# Patient Record
Sex: Female | Born: 2000 | Race: White | Hispanic: No | Marital: Single | Smoking: Current every day smoker
Health system: Southern US, Community
[De-identification: ages and names within clinical notes are randomized; demographics above are authoritative.]

## PROBLEM LIST (undated history)

## (undated) DIAGNOSIS — L309 Dermatitis, unspecified: Secondary | ICD-10-CM

## (undated) DIAGNOSIS — J45909 Unspecified asthma, uncomplicated: Secondary | ICD-10-CM

## (undated) DIAGNOSIS — F419 Anxiety disorder, unspecified: Secondary | ICD-10-CM

## (undated) DIAGNOSIS — F410 Panic disorder [episodic paroxysmal anxiety] without agoraphobia: Secondary | ICD-10-CM

## (undated) DIAGNOSIS — Z9889 Other specified postprocedural states: Secondary | ICD-10-CM

## (undated) DIAGNOSIS — L509 Urticaria, unspecified: Secondary | ICD-10-CM

## (undated) HISTORY — PX: OTHER SURGICAL HISTORY: SHX169

## (undated) HISTORY — PX: SPINAL FUSION: SHX223

## (undated) HISTORY — PX: TEAR DUCT PROBING: SHX793

## (undated) HISTORY — DX: Dermatitis, unspecified: L30.9

## (undated) HISTORY — PX: BACK SURGERY: SHX140

## (undated) HISTORY — DX: Urticaria, unspecified: L50.9

---
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
## 2018-05-21 ENCOUNTER — Other Ambulatory Visit: Payer: Self-pay | Admitting: Allergy and Immunology

## 2018-10-16 ENCOUNTER — Other Ambulatory Visit: Payer: Self-pay | Admitting: Neurosurgery

## 2018-10-16 DIAGNOSIS — M4120 Other idiopathic scoliosis, site unspecified: Secondary | ICD-10-CM

## 2018-10-27 ENCOUNTER — Other Ambulatory Visit: Payer: BLUE CROSS/BLUE SHIELD

## 2018-11-07 ENCOUNTER — Other Ambulatory Visit: Payer: BLUE CROSS/BLUE SHIELD

## 2018-12-11 ENCOUNTER — Emergency Department (HOSPITAL_BASED_OUTPATIENT_CLINIC_OR_DEPARTMENT_OTHER)
Admission: EM | Admit: 2018-12-11 | Discharge: 2018-12-12 | Disposition: A | Discharge status: Home / Self Care | Payer: BC Managed Care – PPO | Attending: Emergency Medicine | Admitting: Emergency Medicine

## 2018-12-11 ENCOUNTER — Other Ambulatory Visit: Payer: Self-pay

## 2018-12-11 ENCOUNTER — Encounter (HOSPITAL_BASED_OUTPATIENT_CLINIC_OR_DEPARTMENT_OTHER): Payer: Self-pay | Admitting: Adult Health

## 2018-12-11 DIAGNOSIS — R109 Unspecified abdominal pain: Secondary | ICD-10-CM | POA: Diagnosis not present

## 2018-12-11 DIAGNOSIS — F17290 Nicotine dependence, other tobacco product, uncomplicated: Secondary | ICD-10-CM | POA: Diagnosis not present

## 2018-12-11 DIAGNOSIS — Z79899 Other long term (current) drug therapy: Secondary | ICD-10-CM | POA: Insufficient documentation

## 2018-12-11 DIAGNOSIS — J45909 Unspecified asthma, uncomplicated: Secondary | ICD-10-CM | POA: Insufficient documentation

## 2018-12-11 DIAGNOSIS — R102 Pelvic and perineal pain: Secondary | ICD-10-CM | POA: Insufficient documentation

## 2018-12-11 DIAGNOSIS — Z91040 Latex allergy status: Secondary | ICD-10-CM | POA: Diagnosis not present

## 2018-12-11 NOTE — ED Triage Notes (Signed)
Rebecca West presents with bilateral flank pain, dysuria, frequent urination, and lower abdominal pain that began over a week ago. SHe has a hx of back surgeries and chronic back pain. So she thought it was just her back pain, however the pain became worse and is now associated with nausea.

## 2018-12-12 ENCOUNTER — Encounter (HOSPITAL_BASED_OUTPATIENT_CLINIC_OR_DEPARTMENT_OTHER): Payer: Self-pay

## 2018-12-12 ENCOUNTER — Ambulatory Visit (HOSPITAL_BASED_OUTPATIENT_CLINIC_OR_DEPARTMENT_OTHER)
Admit: 2018-12-12 | Discharge: 2018-12-12 | Disposition: A | Discharge status: Home / Self Care | Payer: BC Managed Care – PPO | Attending: Emergency Medicine | Admitting: Emergency Medicine

## 2018-12-12 LAB — PREGNANCY, URINE: Preg Test, Ur: NEGATIVE

## 2018-12-12 LAB — URINALYSIS, ROUTINE W REFLEX MICROSCOPIC
Bilirubin Urine: NEGATIVE
Glucose, UA: NEGATIVE mg/dL
Hgb urine dipstick: NEGATIVE
Ketones, ur: NEGATIVE mg/dL
Leukocytes,Ua: NEGATIVE
Nitrite: NEGATIVE
Protein, ur: NEGATIVE mg/dL
Specific Gravity, Urine: 1.015 (ref 1.005–1.030)
pH: 7 (ref 5.0–8.0)

## 2018-12-12 LAB — COMPREHENSIVE METABOLIC PANEL
ALT: 11 U/L (ref 0–44)
AST: 12 U/L — ABNORMAL LOW (ref 15–41)
Albumin: 3.7 g/dL (ref 3.5–5.0)
Alkaline Phosphatase: 66 U/L (ref 38–126)
Anion gap: 10 (ref 5–15)
BUN: 8 mg/dL (ref 6–20)
CO2: 24 mmol/L (ref 22–32)
Calcium: 9 mg/dL (ref 8.9–10.3)
Chloride: 102 mmol/L (ref 98–111)
Creatinine, Ser: 0.69 mg/dL (ref 0.44–1.00)
GFR calc Af Amer: >60 mL/min (ref >60)
GFR calc non Af Amer: >60 mL/min (ref >60)
Glucose, Bld: 101 mg/dL — ABNORMAL HIGH (ref 70–99)
Potassium: 3.9 mmol/L (ref 3.5–5.1)
Sodium: 136 mmol/L (ref 135–145)
Total Bilirubin: 0.3 mg/dL (ref 0.3–1.2)
Total Protein: 7.1 g/dL (ref 6.5–8.1)

## 2018-12-12 LAB — CBC
HCT: 39.5 % (ref 36.0–46.0)
Hemoglobin: 13.5 g/dL (ref 12.0–15.0)
MCH: 29.6 pg (ref 26.0–34.0)
MCHC: 34.2 g/dL (ref 30.0–36.0)
MCV: 86.6 fL (ref 80.0–100.0)
Platelets: 300 10*3/uL (ref 150–400)
RBC: 4.56 MIL/uL (ref 3.87–5.11)
RDW: 11.7 % (ref 11.5–15.5)
WBC: 6.6 10*3/uL (ref 4.0–10.5)
nRBC: 0 % (ref 0.0–0.2)

## 2018-12-12 LAB — LIPASE, BLOOD: Lipase: 48 U/L (ref 11–51)

## 2018-12-12 LAB — WET PREP, GENITAL
Clue Cells Wet Prep HPF POC: NONE SEEN
Sperm: NONE SEEN
Trich, Wet Prep: NONE SEEN
Yeast Wet Prep HPF POC: NONE SEEN

## 2018-12-12 MED ORDER — ONDANSETRON HCL 4 MG/2ML IJ SOLN
4.0000 mg | Freq: Once | INTRAMUSCULAR | Status: AC | PRN
  Administered 2018-12-12: 4 mg via INTRAVENOUS
  Filled 2018-12-12: qty 2

## 2018-12-12 NOTE — ED Provider Notes (Signed)
[facility] Provider Note: [name], MD, FACEP  CSN: [id number] MRN: [id number] ARRIVAL: 12/11/18 at 20 ROOM: [place]  Flank Pain   HISTORY OF PRESENT ILLNESS  12/12/18 1:05 AM Rebecca West is a 18 y.o. 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  She is here with 4 days of lower abdominal pain which she describes as having both sharp and crampy components.  She rates her pain as a 7 out of 10.  There is associated bilateral flank pain and left lower back pain as well.  She is not sure if this is like her usual back pain.  She also complains of frequent urination and nausea but no vomiting or diarrhea.  She denies dysuria, fever, chills, cough and shortness of breath.   Past Medical History:  Diagnosis Date  . Anxiety   . Asthma   . Eczema   . History of kidney surgery   . Panic attack   . Urticaria     Past Surgical History:  Procedure Laterality Date  . BACK SURGERY    . bilateral urinary deflux correction    . micro discectomy    . multiple oral surgeries    . SPINAL FUSION    . TEAR DUCT PROBING      Family History  Problem Relation Age of Onset  . Asthma Sister   . Eczema Sister   . Eczema Brother   . Allergic rhinitis Neg Hx   . Urticaria Neg Hx     Social History   Tobacco Use  . Smoking status: Current Every Day Smoker    Types: E-cigarettes  . Smokeless tobacco: Never Used  Substance Use Topics  . Alcohol use: No    Frequency: Never  . Drug use: No    Prior to Admission medications   Medication Sig Start Date End Date Taking? Authorizing Provider  ALBUTEROL IN Inhale into the lungs.    [provider]  EPINEPHrine 0.3 mg/0.3 mL IJ SOAJ injection Inject into the muscle. 08/02/15   [provider]  escitalopram (LEXAPRO) 20 MG tablet Take 20 mg by mouth daily.    [provider]  fluticasone (FLONASE) 50 MCG/ACT nasal spray Place 1-2 sprays into both nostrils  daily. 11/28/17   Bobbitt, Sedalia Muta, MD  hydrOXYzine (ATARAX/VISTARIL) 25 MG tablet Take 25 mg by mouth 3 (three) times daily as needed.    [provider]  levocetirizine (XYZAL) 5 MG tablet TAKE 1 TABLET BY MOUTH EVERY DAY IN THE EVENING 05/21/18   Bobbitt, Sedalia Muta, MD  loratadine (CLARITIN) 10 MG tablet Take 10 mg by mouth daily.    [provider]  norethindrone-ethinyl estradiol (JUNEL FE,GILDESS FE,LOESTRIN FE) 1-20 MG-MCG tablet Take 1 tablet by mouth daily.    [provider]  Olopatadine HCl (PAZEO) 0.7 % SOLN Place 1 drop into both eyes 1 day or 1 dose. 11/28/17   Bobbitt, Sedalia Muta, MD  ranitidine (ZANTAC) 150 MG tablet Take 1 tablet (150 mg total) by mouth 2 (two) times daily. 11/28/17   Bobbitt, Sedalia Muta, MD  triamcinolone ointment (KENALOG) 0.1 % Apply twice daily to red itchy areas below face. 12/02/17   Bobbitt, Sedalia Muta, MD    Allergies Banana, Azithromycin, Latex, and Prednisone   REVIEW OF SYSTEMS  Negative except as noted here or in the History of Present Illness.   PHYSICAL EXAMINATION  Initial Vital Signs Blood pressure 96/67, pulse 86, temperature 99.2 F (  37.3 C), temperature source Oral, resp. rate 20, height 5\' 3"  (1.6 m), weight 93.9 kg, last menstrual period 12/02/2018, SpO2 97 %.  Examination General: Well-developed, well-nourished female in no acute distress; appearance consistent with age of record HENT: normocephalic; atraumatic Eyes: pupils equal, round and reactive to light; extraocular muscles intact Neck: supple Heart: regular rate and rhythm Lungs: clear to auscultation bilaterally Abdomen: soft; nondistended; suprapubic tenderness; bowel sounds present GU: Normal external genitalia; physiologic appearing vaginal discharge; slight vaginal bleeding; no cervical motion tenderness; right adnexal tenderness Back: Bilateral CVA tenderness, left paraspinal tenderness Extremities: No deformity; full range of  motion; pulses normal Neurologic: Awake, alert and oriented; motor function intact in all extremities and symmetric; no facial droop Skin: Warm and dry Psychiatric: Normal mood and affect   RESULTS  Summary of this visit's results, reviewed by myself:   EKG Interpretation  Date/Time:    Ventricular Rate:    PR Interval:    QRS Duration:   QT Interval:    QTC Calculation:   R Axis:     Text Interpretation:        Laboratory Studies: Results for orders placed or performed during the hospital encounter of 12/11/18 (from the past 24 hour(s))  Pregnancy, urine     Status: None   Collection Time: 12/12/18 12:00 AM  Result Value Ref Range   Preg Test, Ur NEGATIVE NEGATIVE  Urinalysis, Routine w reflex microscopic     Status: None   Collection Time: 12/12/18 12:00 AM  Result Value Ref Range   Color, Urine YELLOW YELLOW   APPearance CLEAR CLEAR   Specific Gravity, Urine 1.015 1.005 - 1.030   pH 7.0 5.0 - 8.0   Glucose, UA NEGATIVE NEGATIVE mg/dL   Hgb urine dipstick NEGATIVE NEGATIVE   Bilirubin Urine NEGATIVE NEGATIVE   Ketones, ur NEGATIVE NEGATIVE mg/dL   Protein, ur NEGATIVE NEGATIVE mg/dL   Nitrite NEGATIVE NEGATIVE   Leukocytes,Ua NEGATIVE NEGATIVE  Lipase, blood     Status: None   Collection Time: 12/12/18 12:44 AM  Result Value Ref Range   Lipase 48 11 - 51 U/L  Comprehensive metabolic panel     Status: Abnormal   Collection Time: 12/12/18 12:44 AM  Result Value Ref Range   Sodium 136 135 - 145 mmol/L   Potassium 3.9 3.5 - 5.1 mmol/L   Chloride 102 98 - 111 mmol/L   CO2 24 22 - 32 mmol/L   Glucose, Bld 101 (H) 70 - 99 mg/dL   BUN 8 6 - 20 mg/dL   Creatinine, Ser 9.810.69 0.44 - 1.00 mg/dL   Calcium 9.0 8.9 - 19.110.3 mg/dL   Total Protein 7.1 6.5 - 8.1 g/dL   Albumin 3.7 3.5 - 5.0 g/dL   AST 12 (L) 15 - 41 U/L   ALT 11 0 - 44 U/L   Alkaline Phosphatase 66 38 - 126 U/L   Total Bilirubin 0.3 0.3 - 1.2 mg/dL   GFR calc non Af Amer >60 >60 mL/min   GFR calc Af Amer  >60 >60 mL/min   Anion gap 10 5 - 15  CBC     Status: None   Collection Time: 12/12/18 12:44 AM  Result Value Ref Range   WBC 6.6 4.0 - 10.5 K/uL   RBC 4.56 3.87 - 5.11 MIL/uL   Hemoglobin 13.5 12.0 - 15.0 g/dL   HCT 47.839.5 29.536.0 - 62.146.0 %   MCV 86.6 80.0 - 100.0 fL   MCH 29.6 26.0 - 34.0 pg  MCHC 34.2 30.0 - 36.0 g/dL   RDW 86.7 67.2 - 09.4 %   Platelets 300 150 - 400 K/uL   nRBC 0.0 0.0 - 0.2 %  Wet prep, genital     Status: Abnormal   Collection Time: 12/12/18  1:42 AM   Specimen: Genital  Result Value Ref Range   Yeast Wet Prep HPF POC NONE SEEN NONE SEEN   Trich, Wet Prep NONE SEEN NONE SEEN   Clue Cells Wet Prep HPF POC NONE SEEN NONE SEEN   WBC, Wet Prep HPF POC FEW (A) NONE SEEN   Sperm NONE SEEN    Imaging Studies: No results found.  ED COURSE and MDM  Nursing notes and initial vitals signs, including pulse oximetry, reviewed.  Vitals:   12/11/18 2355 12/11/18 2356  BP: 96/67   Pulse: 86   Resp: 20   Temp: 99.2 F (37.3 C)   TempSrc: Oral   SpO2: 97%   Weight:  93.9 kg  Height:  5\' 3"  (1.6 m)   Physical exam findings are concerning for right ovarian cyst.  We will have the patient return for pelvic ultrasound later today.  There is no evidence of urinary tract infection.  GC and Chlamydia are pending.  PROCEDURES    ED DIAGNOSES     ICD-10-CM   1. Pelvic pain  R10.2   2. Flank pain  R10.9        Roanne Haye, Jonny Ruiz, MD 12/12/18 [phone]

## 2018-12-13 LAB — GC/CHLAMYDIA PROBE AMP (~~LOC~~) NOT AT ARMC
Chlamydia: NEGATIVE
Neisseria Gonorrhea: NEGATIVE

## 2019-01-28 ENCOUNTER — Other Ambulatory Visit: Payer: Self-pay | Admitting: *Deleted

## 2019-01-30 ENCOUNTER — Other Ambulatory Visit: Payer: Self-pay

## 2019-01-30 MED ORDER — FLUTICASONE PROPIONATE 50 MCG/ACT NA SUSP: 1.0000 | Freq: Every day | NASAL | 5 refills | Status: AC

## 2019-01-30 NOTE — Telephone Encounter (Signed)
Ok refill fluticasone nasal spray.  Last ov 11/29/2018.

## 2020-03-06 ENCOUNTER — Other Ambulatory Visit: Payer: Self-pay | Admitting: Allergy and Immunology

## 2021-05-25 IMAGING — US US PELVIS COMPLETE
1 series · 13 of 25 positions shown · non-contrast
Comparison: None

CLINICAL DATA: RIGHT adnexal pain and tenderness on exam, BILATERAL
flank and lower abdominal pain, dysuria and frequency with nausea
for 1 week



[Series 1: us pelvis complete · 125 acquisitions, 13 frames shown]
[im 1/125]
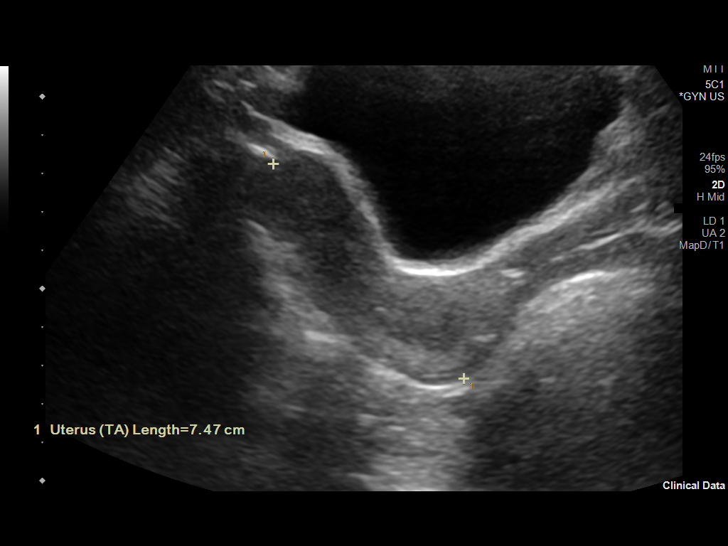
[im 11/125]
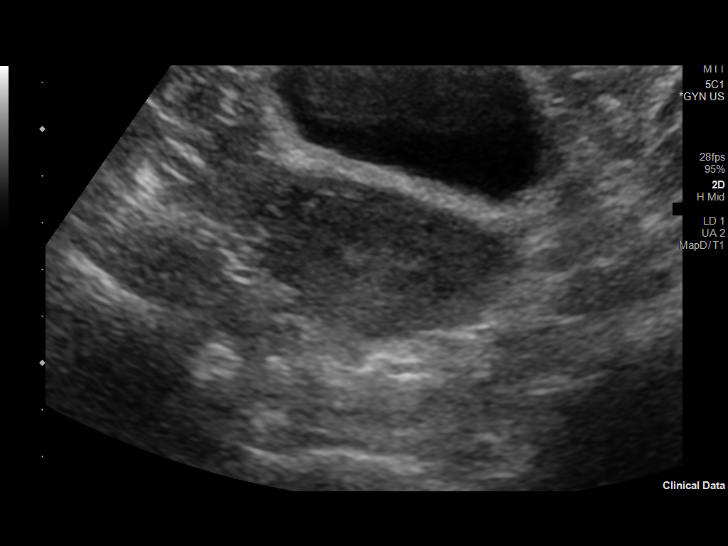
[im 21/125]
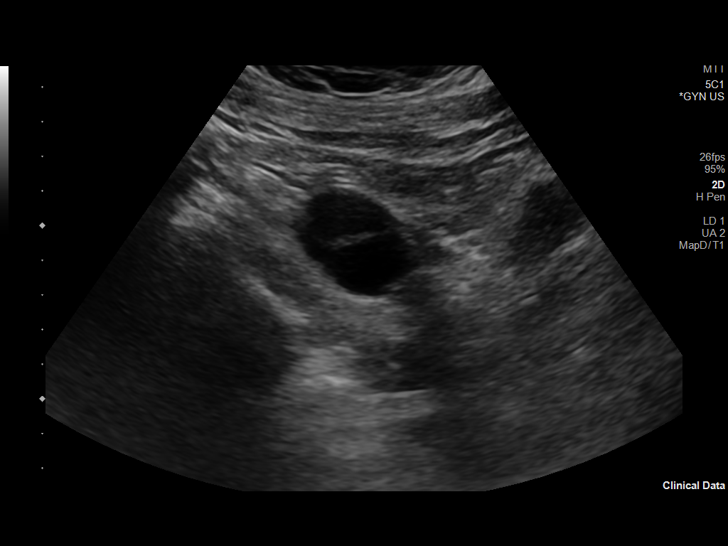
[im 32/125]
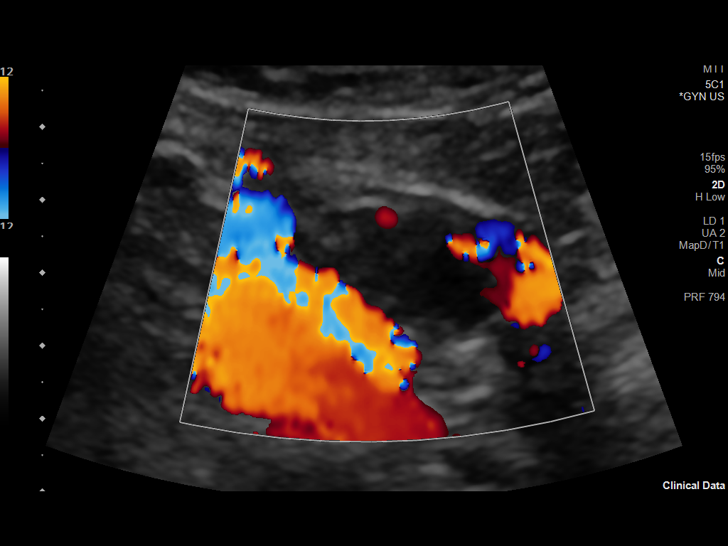
[im 42/125]
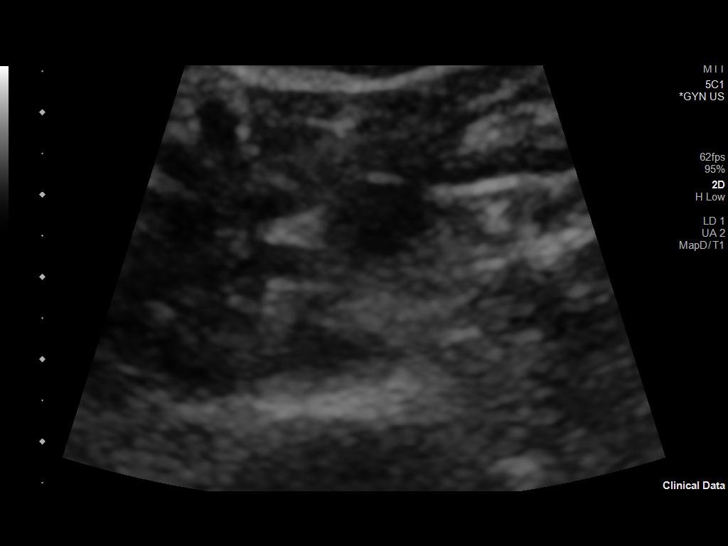
[im 52/125]
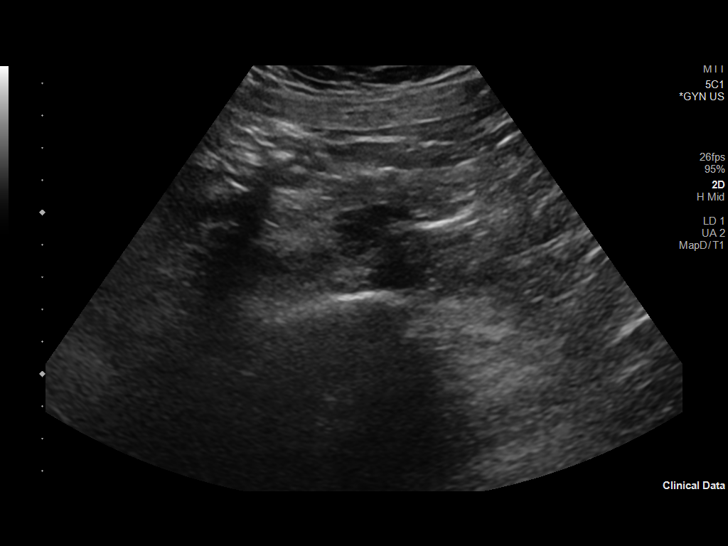
[im 63/125]
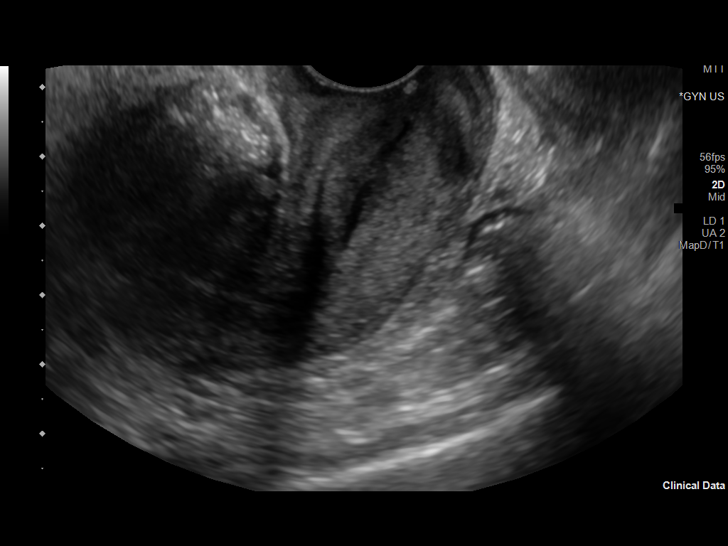
[im 73/125]
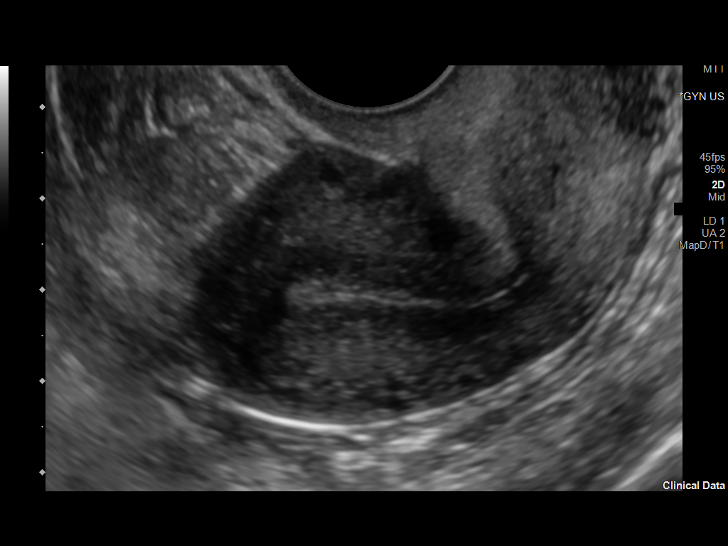
[im 83/125]
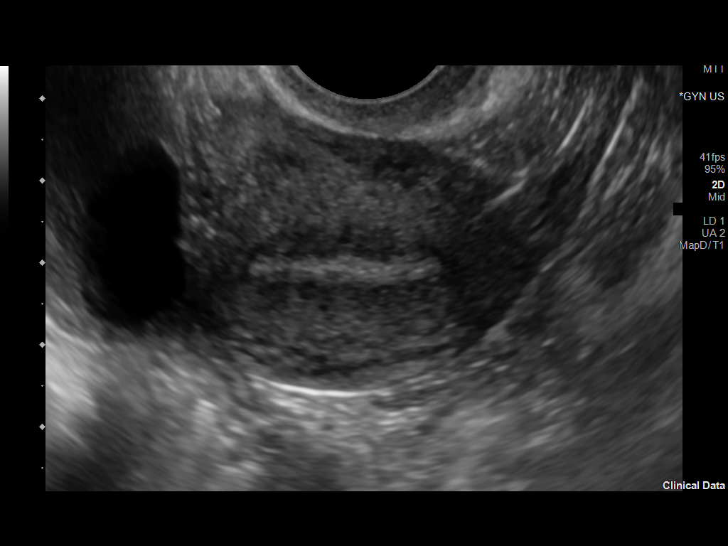
[im 94/125]
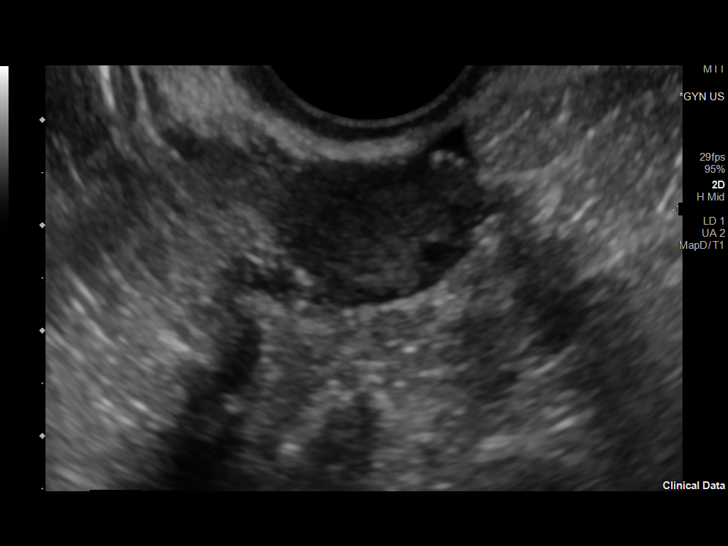
[im 104/125]
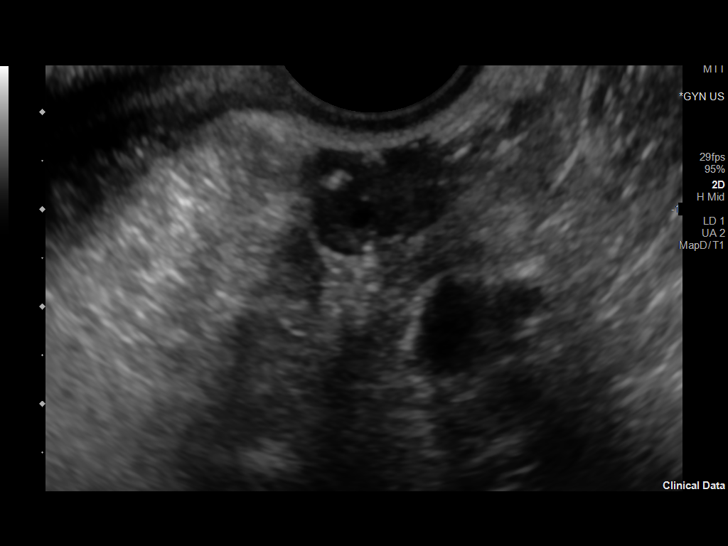
[im 114/125]
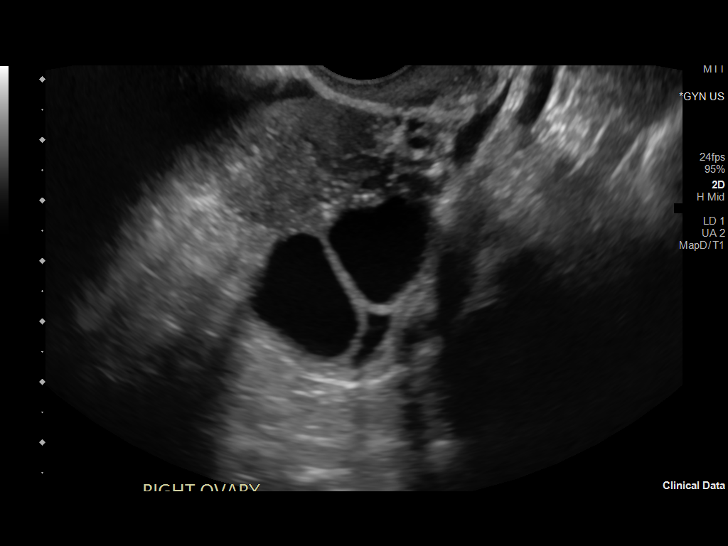
[im 125/125]
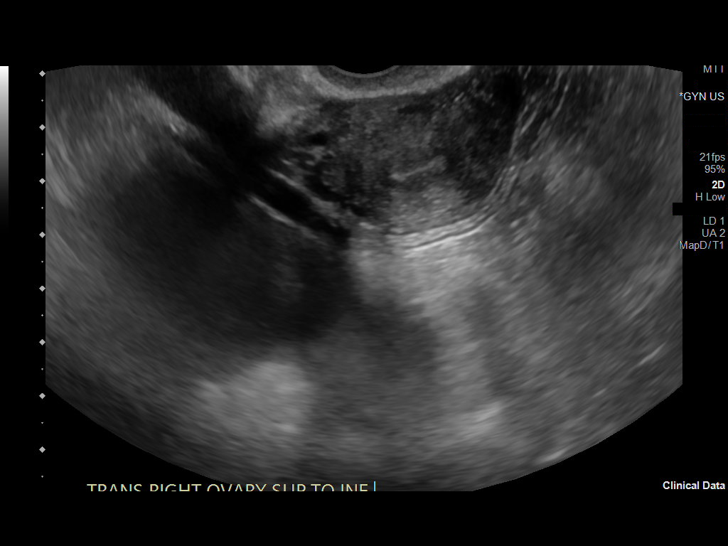

[13 of 25 positions shown; findings below may reference images not displayed]

FINDINGS: Uterus

Measurements: 6.2 x 3.0 x 4.0 cm = volume: 39 mL. Anteflexed. Normal
morphology without mass

Endometrium

Thickness: 4 mm.  No endometrial fluid or focal abnormality

Right ovary

Measurements: 3.8 x 2.6 x 3.0 cm = volume: 15.6 mL. Multiple
follicles without mass. Blood flow present within RIGHT ovary on
color Doppler imaging.

Left ovary

Measurements: 2.2 x 1.5 x 2.0 cm = volume: 3.4 mL. 9 mm
calcification question scar. No additional mass. Blood flow present
within LEFT ovary on color Doppler imaging.

Other findings

Trace free pelvic fluid.  No adnexal masses.
IMPRESSION: No acute pelvic sonographic abnormalities identified.

## 2021-05-25 IMAGING — US US TRANSVAGINAL NON-OB
1 series · 13 of 25 positions shown · non-contrast
Comparison: None

CLINICAL DATA: RIGHT adnexal pain and tenderness on exam, BILATERAL
flank and lower abdominal pain, dysuria and frequency with nausea
for 1 week



[Series 1: us transvaginal non-ob · 125 acquisitions, 13 frames shown]
[im 1/125]
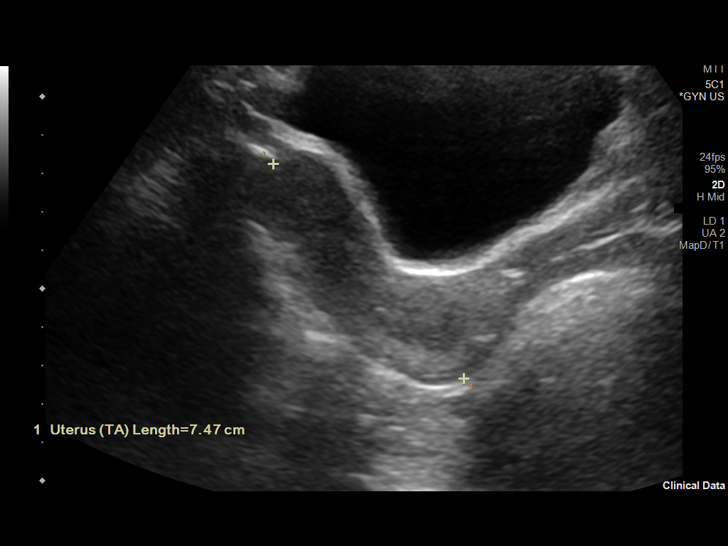
[im 11/125]
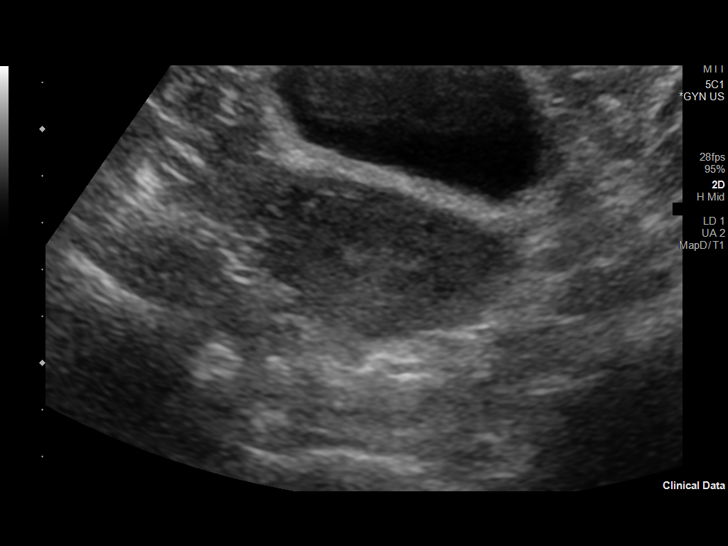
[im 21/125]
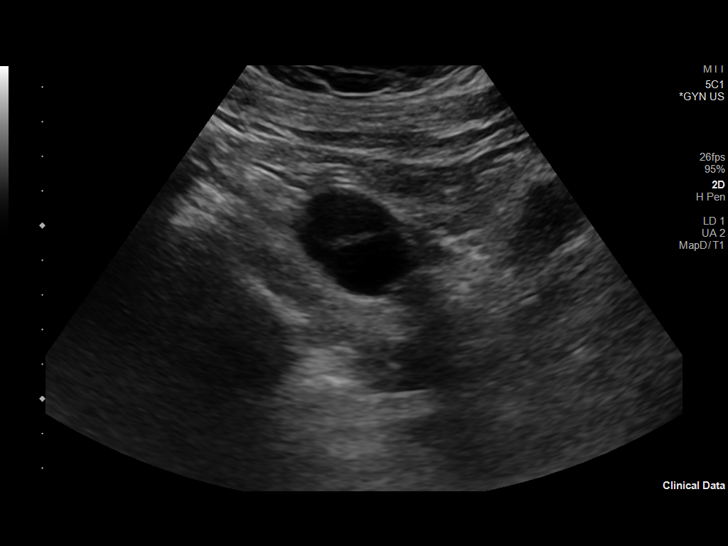
[im 32/125]
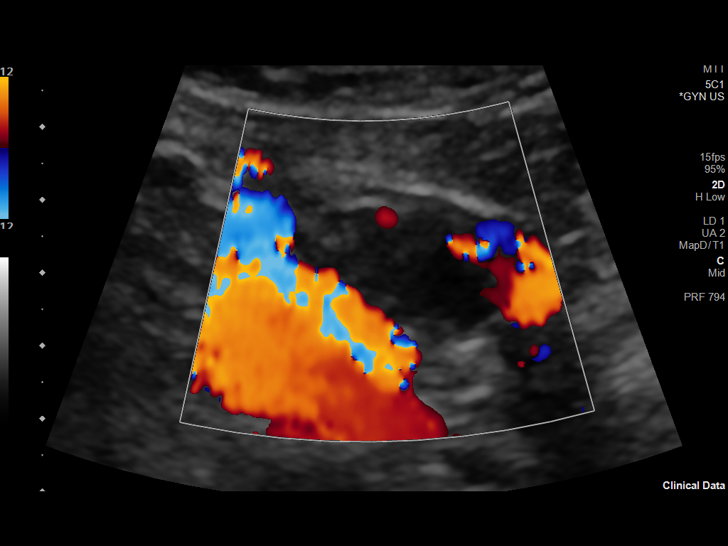
[im 42/125]
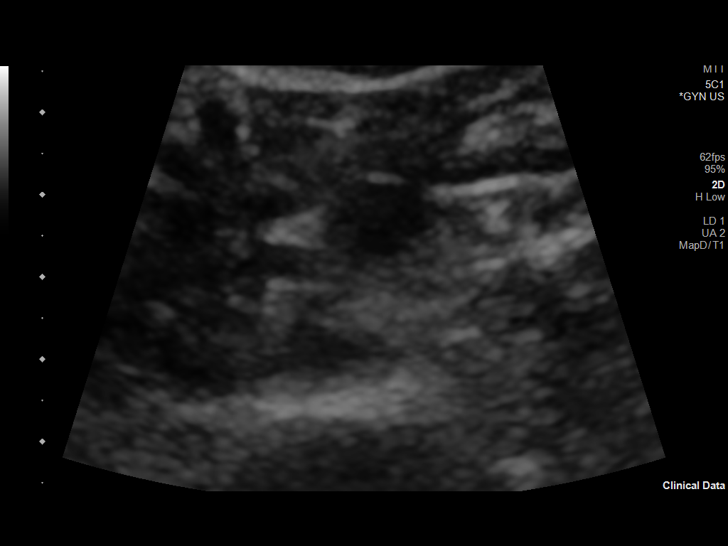
[im 52/125]
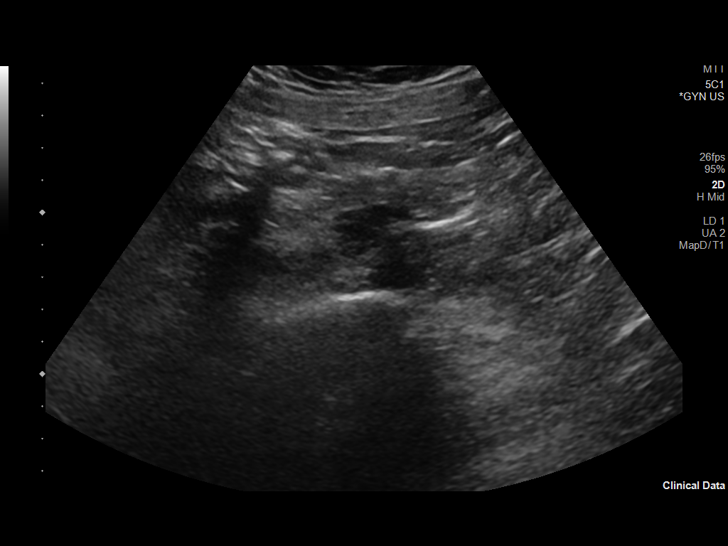
[im 63/125]
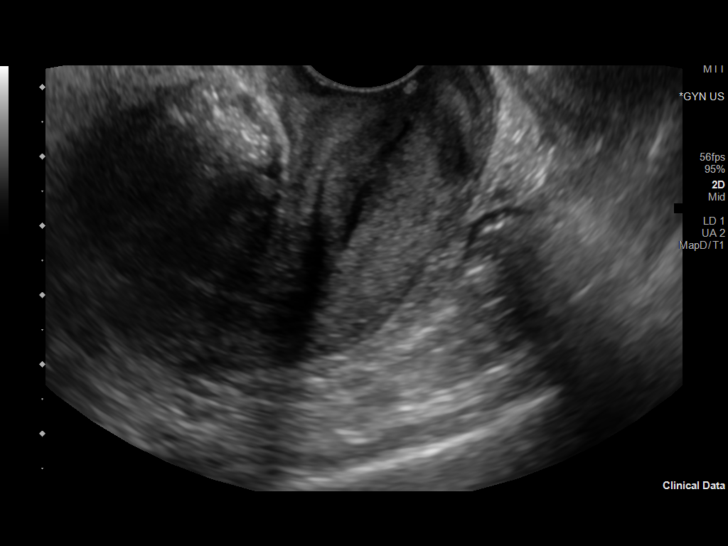
[im 73/125]
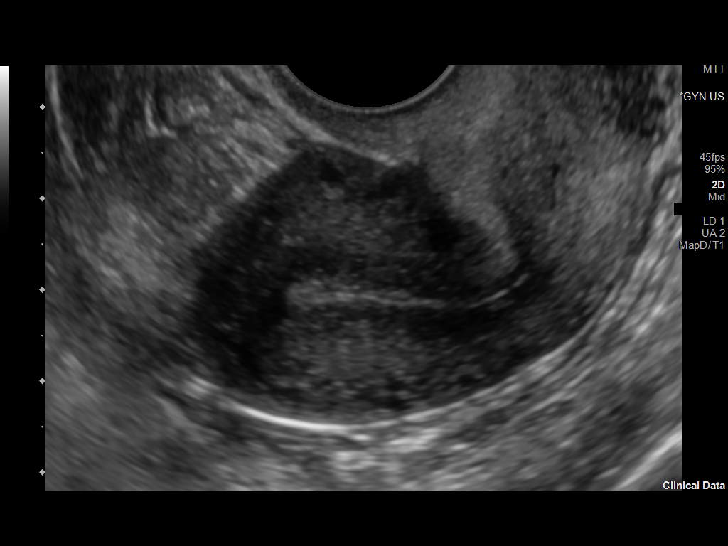
[im 83/125]
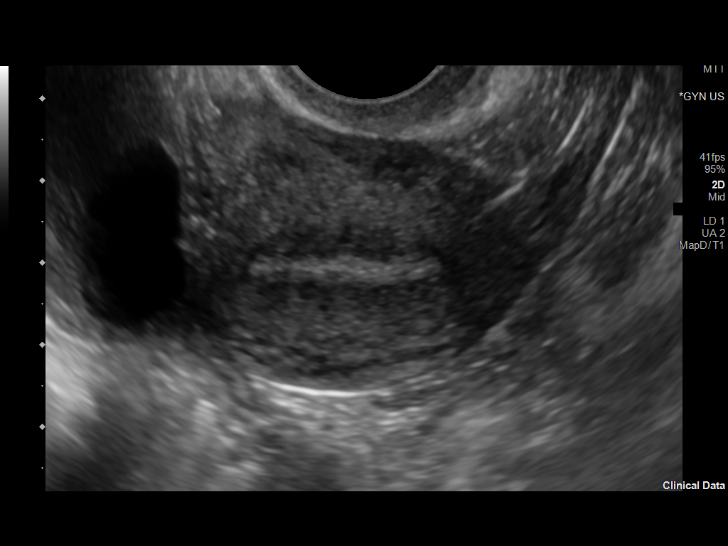
[im 94/125]
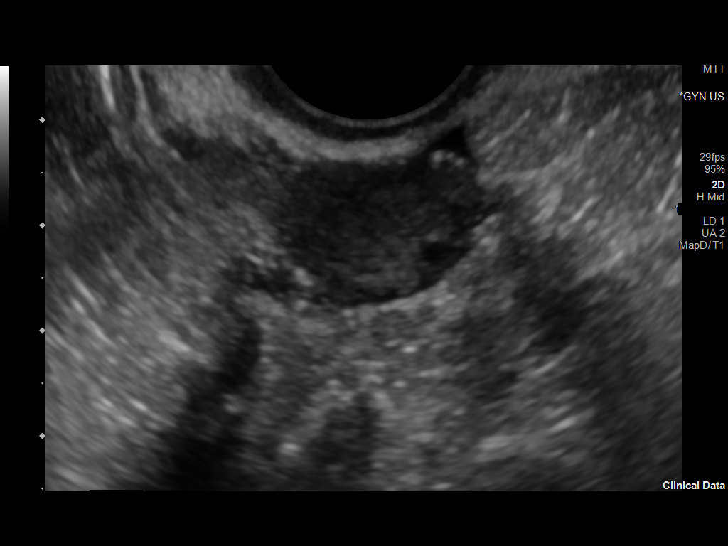
[im 104/125]
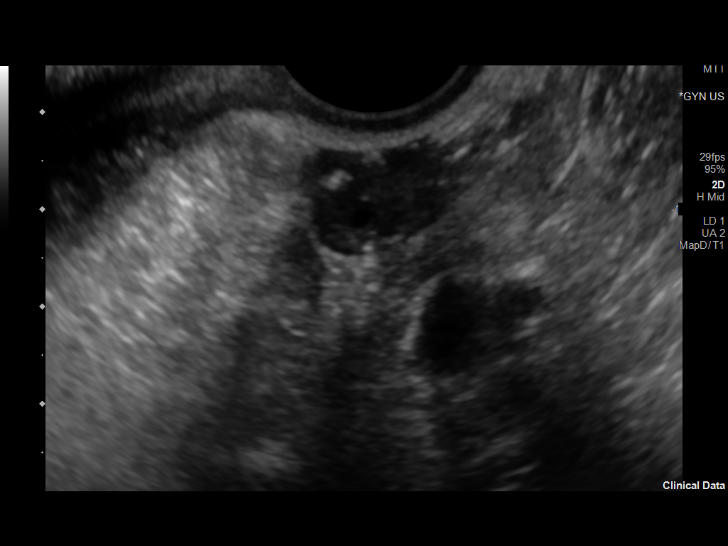
[im 114/125]
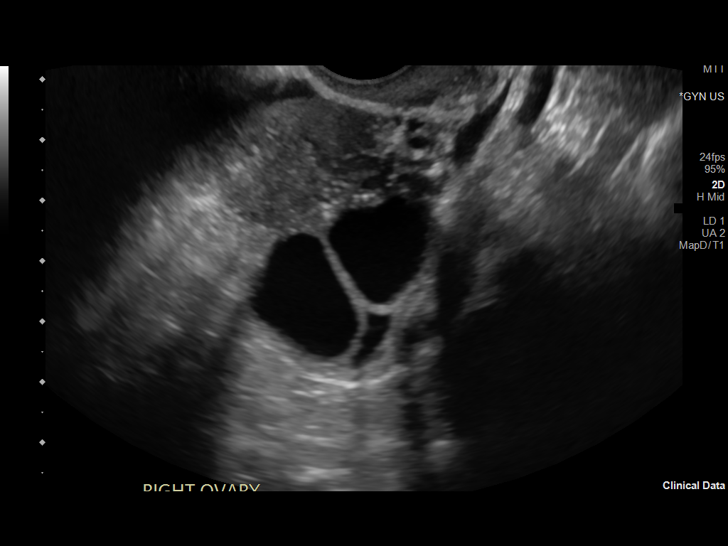
[im 125/125]
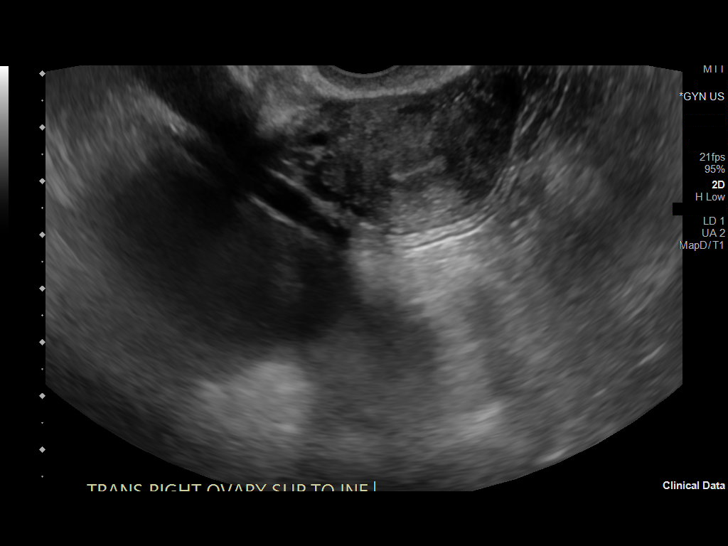

[13 of 25 positions shown; findings below may reference images not displayed]

FINDINGS: Uterus

Measurements: 6.2 x 3.0 x 4.0 cm = volume: 39 mL. Anteflexed. Normal
morphology without mass

Endometrium

Thickness: 4 mm.  No endometrial fluid or focal abnormality

Right ovary

Measurements: 3.8 x 2.6 x 3.0 cm = volume: 15.6 mL. Multiple
follicles without mass. Blood flow present within RIGHT ovary on
color Doppler imaging.

Left ovary

Measurements: 2.2 x 1.5 x 2.0 cm = volume: 3.4 mL. 9 mm
calcification question scar. No additional mass. Blood flow present
within LEFT ovary on color Doppler imaging.

Other findings

Trace free pelvic fluid.  No adnexal masses.
IMPRESSION: No acute pelvic sonographic abnormalities identified.
